# Patient Record
Sex: Male | Born: 1965 | Race: White | Hispanic: No | Marital: Married | Smoking: Never smoker
Health system: Southern US, Community
[De-identification: ages and names within clinical notes are randomized; demographics above are authoritative.]

## PROBLEM LIST (undated history)

## (undated) DIAGNOSIS — Z789 Other specified health status: Secondary | ICD-10-CM

## (undated) HISTORY — PX: VASECTOMY: SHX75

---

## 2003-05-19 ENCOUNTER — Observation Stay (HOSPITAL_COMMUNITY): Admission: AC | Admit: 2003-05-19 | Discharge: 2003-05-20 | Payer: Self-pay

## 2003-05-19 ENCOUNTER — Encounter: Payer: Self-pay | Admitting: Emergency Medicine

## 2011-04-25 ENCOUNTER — Ambulatory Visit (HOSPITAL_COMMUNITY)
Admission: RE | Admit: 2011-04-25 | Discharge: 2011-04-25 | Disposition: A | Discharge status: Home / Self Care | Payer: BC Managed Care – PPO | Source: Ambulatory Visit | Attending: Pediatrics | Admitting: Pediatrics

## 2011-04-25 ENCOUNTER — Other Ambulatory Visit (HOSPITAL_COMMUNITY): Payer: Self-pay | Admitting: Pediatrics

## 2011-04-25 DIAGNOSIS — M25579 Pain in unspecified ankle and joints of unspecified foot: Secondary | ICD-10-CM | POA: Insufficient documentation

## 2011-04-25 DIAGNOSIS — R52 Pain, unspecified: Secondary | ICD-10-CM

## 2012-09-28 IMAGING — CR DG ANKLE COMPLETE 3+V*L*
3 series · 3 of 3 positions shown · non-contrast
Comparison: None

CLINICAL DATA: Lateral ankle pain, overuse injury 5 weeks ago

LEFT ANKLE COMPLETE - 3+ VIEW

[view not recorded (1 of 3)]
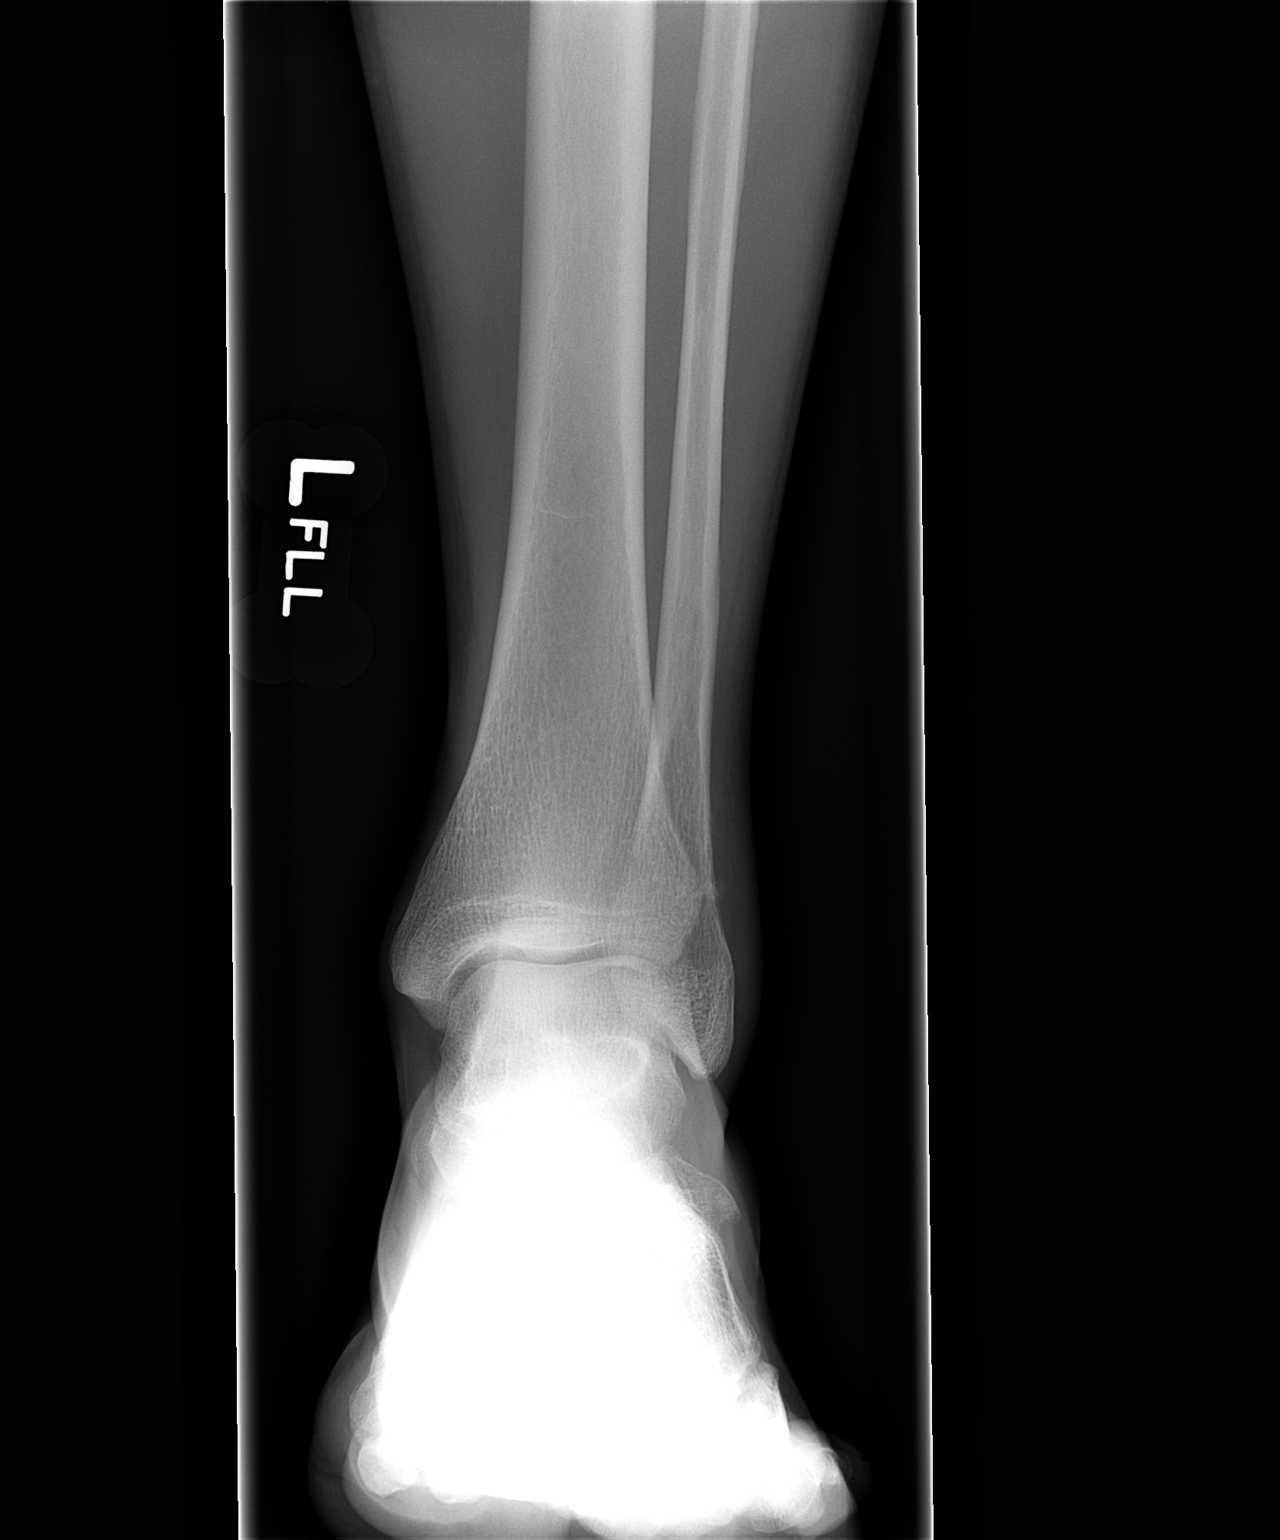

[view not recorded (2 of 3)]
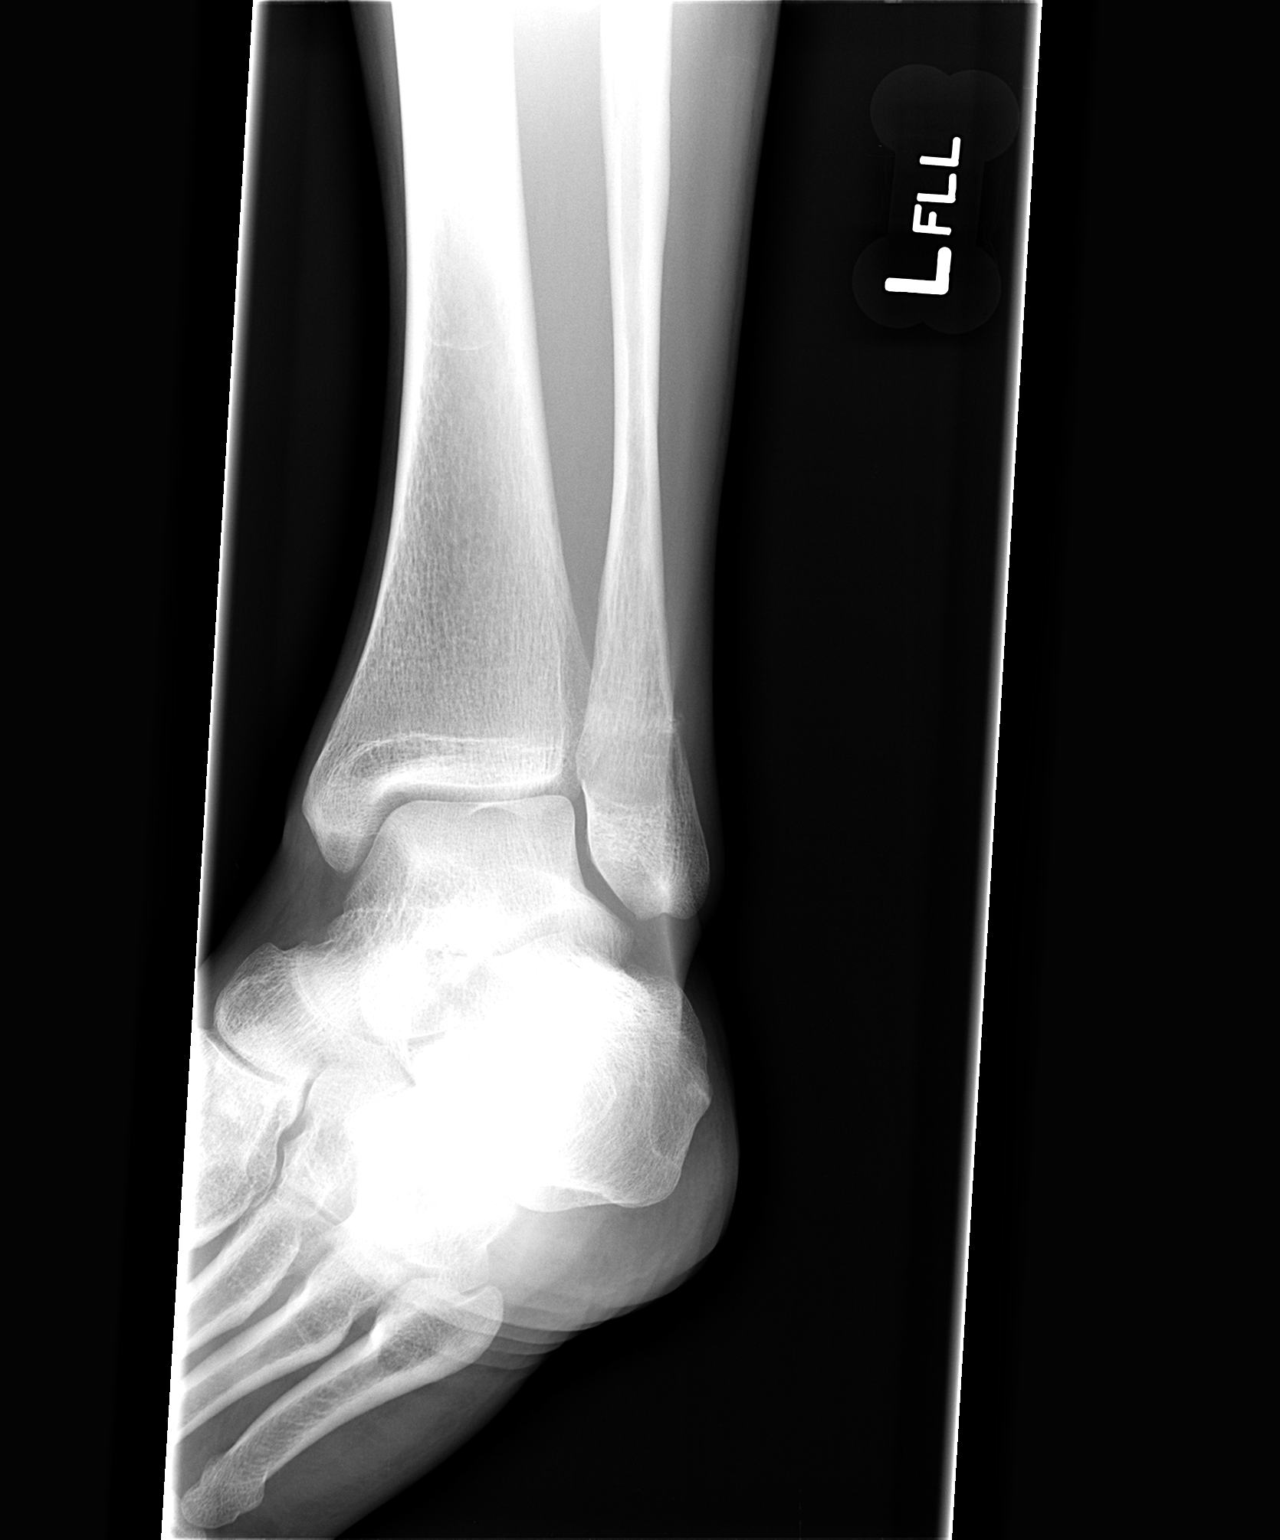

[view not recorded (3 of 3)]
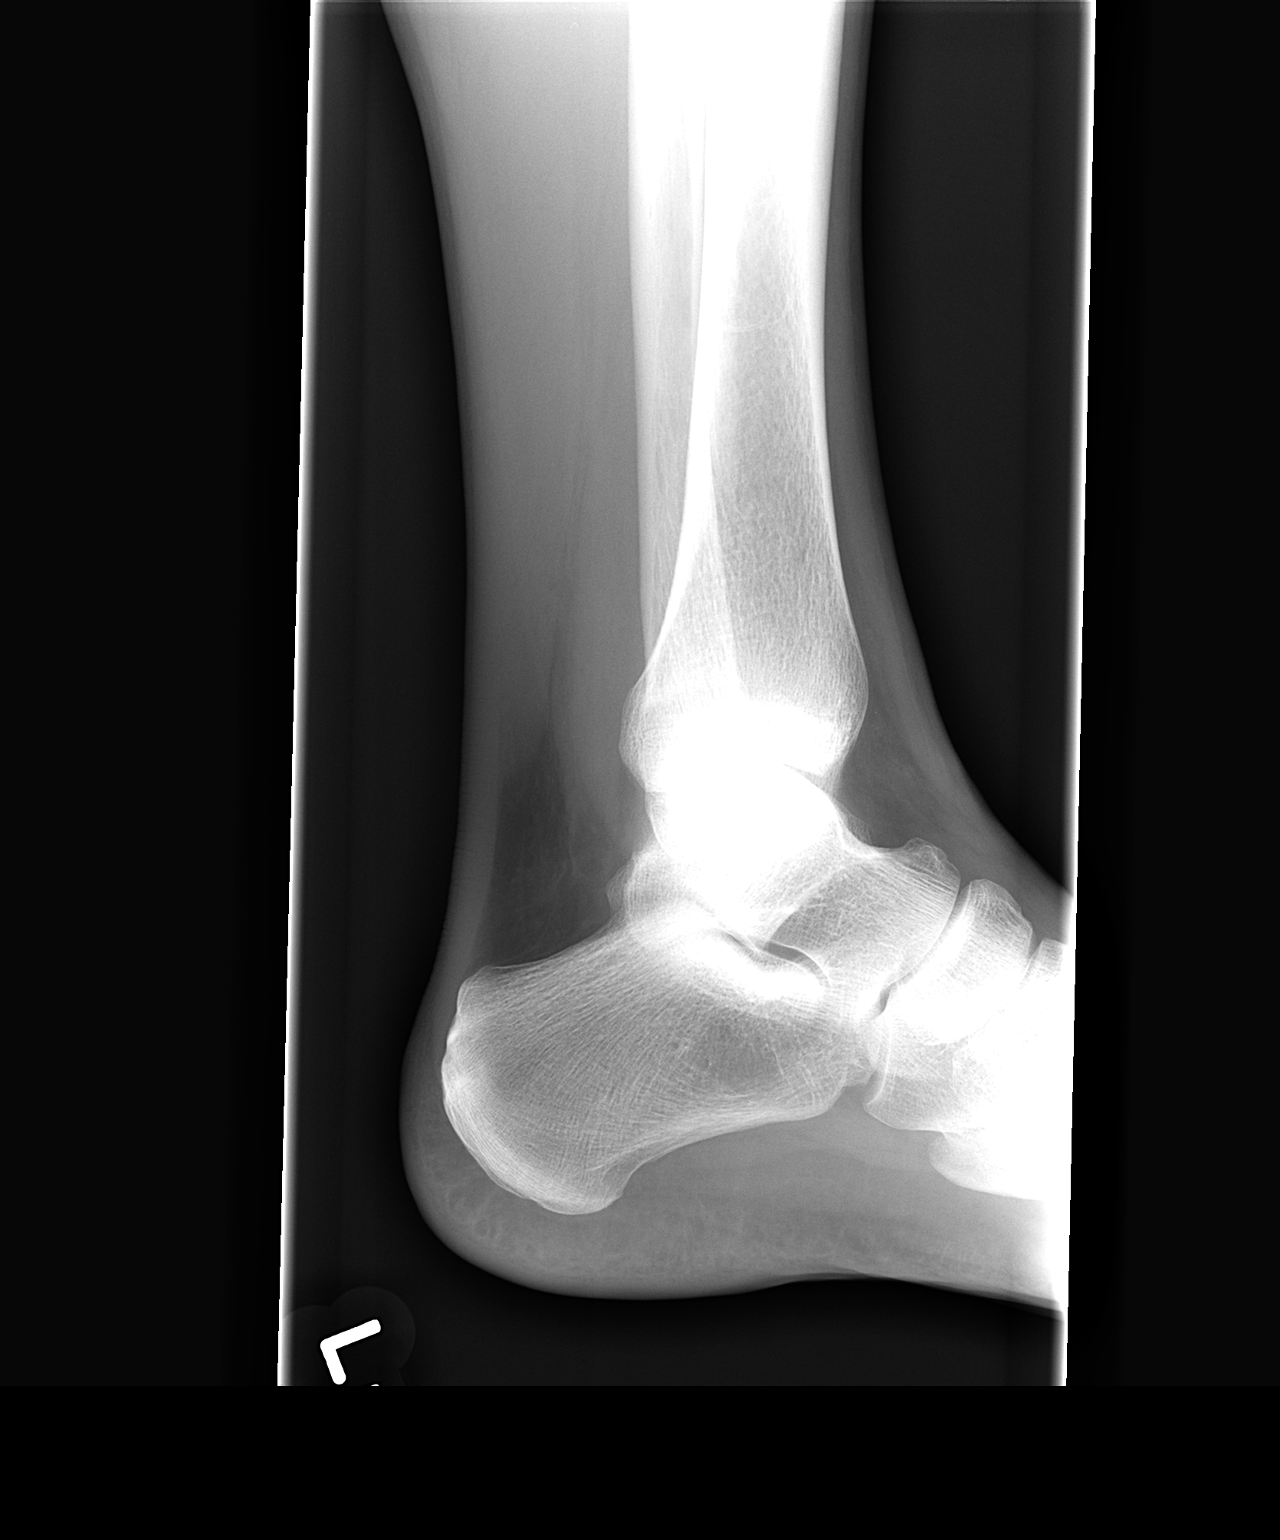

[3 of 3 positions shown; findings below may reference images not displayed]

FINDINGS: Ankle joint space preserved.
Bone mineralization normal.
No acute fracture, dislocation, or bone destruction.
IMPRESSION: Normal exam.

## 2015-08-20 ENCOUNTER — Encounter (HOSPITAL_COMMUNITY): Payer: Self-pay | Admitting: Emergency Medicine

## 2015-08-20 ENCOUNTER — Emergency Department (HOSPITAL_COMMUNITY)
Admission: EM | Admit: 2015-08-20 | Discharge: 2015-08-20 | Disposition: A | Discharge status: Home / Self Care | Payer: BC Managed Care – PPO | Source: Home / Self Care | Attending: Emergency Medicine | Admitting: Emergency Medicine

## 2015-08-20 DIAGNOSIS — R229 Localized swelling, mass and lump, unspecified: Secondary | ICD-10-CM

## 2015-08-20 NOTE — Discharge Instructions (Signed)
Cool compresses, Claritin, Zyrtec during the day, Benadryl at night. Return for the signs and symptoms we discussed.

## 2015-08-20 NOTE — ED Notes (Signed)
C/o right eye itching onset 1800 today associated w/swelling Denies inj/trauma, blurry vision A&O x4... No acute distress.

## 2015-08-20 NOTE — ED Provider Notes (Signed)
HPI  SUBJECTIVE:  Joseph Sims is a 49 y.o. male who presents with painless right-sided infraorbital swelling starting this afternoon. He states that he was itching his right eye, rubbed it lightly, and then the swelling started. No aggravating or alleviating factors. He has not tried anything for this. There is no conjunctival injection. No  erythema. No nausea, vomiting, fevers, visual changes, eye discharge, pain with extraocular movements, photophobia. No allergy symptoms, but patient does have seasonal allergies. No new lotions, soaps, detergents, foods, exposure to chemicals. No sinus pain/pressure, dental pain.  Patient has no other medical problems.   History reviewed. No pertinent past medical history.  History reviewed. No pertinent past surgical history.  No family history on file.  Social History  Substance Use Topics  . Smoking status: Never Smoker   . Smokeless tobacco: None  . Alcohol Use: Yes    No current facility-administered medications for this encounter. No current outpatient prescriptions on file.  No Known Allergies   ROS  As noted in HPI.   Physical Exam  BP 146/87 mmHg  Pulse 61  Temp(Src) 98.2 F (36.8 C) (Oral)  Resp 16  SpO2 100%  Constitutional: Well developed, well nourished, no acute distress Eyes:  EOMI, conjunctiva normal bilaterally. PERRLA, nontender right-sided infraorbital swelling. No erythema. No bruising. No photophobia.  HENT: Normocephalic, atraumatic,mucus membranes moist. No nasal congestion. No sinus tenderness. Respiratory: Normal inspiratory effort Cardiovascular: Normal rate GI: nondistended skin: No rash, skin intact Musculoskeletal: no deformities Neurologic: Alert & oriented x 3, no focal neuro deficits Psychiatric: Speech and behavior appropriate   ED Course   Medications - No data to display  No orders of the defined types were placed in this encounter.    No results found for this or any previous visit  (from the past 24 hour(s)). No results found.  ED Clinical Impression  Localized soft tissue swelling   ED Assessment/Plan Does not appear to be periorbital cellulitis or post-septal cellulitis. There is no evidence of conjunctivitis. No evidence of corneal abrasion. No evidence of sinusitis. Feel this could be an allergic reaction versus localized soft tissue swelling/edema due to minor trauma from rubbing his eye. We'll send home with antihistamines, cool compresses. Follow up with primary care physician as needed. Return here if gets worse, gave patient strict return precautions and when to go to the ER. Patient agrees with plan   *This clinic note was created using Dragon dictation software. Therefore, there may be occasional mistakes despite careful proofreading.  ?   Melynda Ripple, MD 08/20/15 [phone]

## 2015-10-05 ENCOUNTER — Telehealth: Payer: Self-pay

## 2015-10-05 NOTE — Telephone Encounter (Signed)
[id number]  PATIENT RECEIVED LETTER TO SCHEDULE TCS

## 2015-10-13 NOTE — Telephone Encounter (Signed)
Minimal ETOH use. Ok to schedule

## 2015-10-13 NOTE — Telephone Encounter (Signed)
Gastroenterology Pre-Procedure Review  Request Date: 10/05/2015 Requesting Physician: Dr. Wende Neighbors  PATIENT REVIEW QUESTIONS: The patient responded to the following health history questions as indicated:    1. Diabetes Melitis: no  2. Joint replacements in the past 12 months: no 3. Major health problems in the past 3 months: no 4. Has an artificial valve or MVP: no 5. Has a defibrillator: no 6. Has been advised in past to take antibiotics in advance of a procedure like teeth cleaning: no 7. Family history of colon cancer: no  8. Alcohol Use: Drinks a beer every 3 days 9. History of sleep apnea: no     MEDICATIONS & ALLERGIES:    Patient reports the following regarding taking any blood thinners:   Plavix? no Aspirin? no Coumadin? no  Patient confirms/reports the following medications:  No current outpatient prescriptions on file.   No current facility-administered medications for this visit.    Patient confirms/reports the following allergies:  Allergies  Allergen Reactions  . Morphine And Related Nausea Only    No orders of the defined types were placed in this encounter.    AUTHORIZATION INFORMATION Primary Insurance: ID #: Group #:  Pre-Cert / Auth required:  Pre-Cert / Auth #:   Secondary Insurance:   ID #:  Group #:  Pre-Cert / Auth required:  Pre-Cert / Auth #:   SCHEDULE INFORMATION: Procedure has been scheduled as follows:  Date: 11/03/2015             Time: 10:30 Am  Location: [facility] Short Stay  This Gastroenterology Pre-Precedure Review Form is being routed to the following provider(s): R. Garfield Cornea, MD

## 2015-10-13 NOTE — Telephone Encounter (Signed)
See separate triage.  

## 2015-10-14 ENCOUNTER — Other Ambulatory Visit: Payer: Self-pay

## 2015-10-14 DIAGNOSIS — Z1211 Encounter for screening for malignant neoplasm of colon: Secondary | ICD-10-CM

## 2015-10-14 MED ORDER — PEG 3350-KCL-NA BICARB-NACL 420 G PO SOLR: 4000.0000 mL | ORAL | Status: AC

## 2015-10-14 NOTE — Telephone Encounter (Signed)
Rx sent to the pharmacy and instructions mailed to pt.  

## 2015-10-16 ENCOUNTER — Telehealth: Payer: Self-pay

## 2015-10-16 NOTE — Telephone Encounter (Signed)
Please see the scanned in NO PA REQUIRED FOR THE TCS.

## 2015-11-03 ENCOUNTER — Ambulatory Visit (HOSPITAL_COMMUNITY)
Admission: RE | Admit: 2015-11-03 | Discharge: 2015-11-03 | Disposition: A | Discharge status: Home / Self Care | Payer: BC Managed Care – PPO | Source: Ambulatory Visit | Attending: Internal Medicine | Admitting: Internal Medicine

## 2015-11-03 ENCOUNTER — Encounter (HOSPITAL_COMMUNITY)
Admission: RE | Disposition: A | Discharge status: Home / Self Care | Payer: Self-pay | Source: Ambulatory Visit | Attending: Internal Medicine

## 2015-11-03 ENCOUNTER — Encounter (HOSPITAL_COMMUNITY): Payer: Self-pay | Admitting: *Deleted

## 2015-11-03 DIAGNOSIS — Z86010 Personal history of colonic polyps: Secondary | ICD-10-CM | POA: Insufficient documentation

## 2015-11-03 DIAGNOSIS — K635 Polyp of colon: Secondary | ICD-10-CM | POA: Diagnosis not present

## 2015-11-03 DIAGNOSIS — Z800 Family history of malignant neoplasm of digestive organs: Secondary | ICD-10-CM | POA: Insufficient documentation

## 2015-11-03 DIAGNOSIS — Z1211 Encounter for screening for malignant neoplasm of colon: Secondary | ICD-10-CM | POA: Diagnosis not present

## 2015-11-03 DIAGNOSIS — D124 Benign neoplasm of descending colon: Secondary | ICD-10-CM | POA: Insufficient documentation

## 2015-11-03 DIAGNOSIS — K5730 Diverticulosis of large intestine without perforation or abscess without bleeding: Secondary | ICD-10-CM | POA: Diagnosis not present

## 2015-11-03 HISTORY — DX: Other specified health status: Z78.9

## 2015-11-03 HISTORY — PX: COLONOSCOPY: SHX5424

## 2015-11-03 SURGERY — COLONOSCOPY
Anesthesia: Moderate Sedation

## 2015-11-03 MED ORDER — MIDAZOLAM HCL 5 MG/5ML IJ SOLN
INTRAMUSCULAR | Status: DC | PRN
  Administered 2015-11-03: 1 mg via INTRAVENOUS
  Administered 2015-11-03 (×2): 2 mg via INTRAVENOUS

## 2015-11-03 MED ORDER — MIDAZOLAM HCL 5 MG/5ML IJ SOLN
INTRAMUSCULAR | Status: AC
  Filled 2015-11-03: qty 10

## 2015-11-03 MED ORDER — SODIUM CHLORIDE 0.9 % IV SOLN
INTRAVENOUS | Status: DC
  Administered 2015-11-03: 09:00:00 via INTRAVENOUS

## 2015-11-03 MED ORDER — ONDANSETRON HCL 4 MG/2ML IJ SOLN
INTRAMUSCULAR | Status: AC
  Filled 2015-11-03: qty 2

## 2015-11-03 MED ORDER — MEPERIDINE HCL 100 MG/ML IJ SOLN
INTRAMUSCULAR | Status: DC | PRN
  Administered 2015-11-03: 25 mg via INTRAVENOUS
  Administered 2015-11-03: 50 mg via INTRAVENOUS
  Administered 2015-11-03: 25 mg via INTRAVENOUS

## 2015-11-03 MED ORDER — MEPERIDINE HCL 100 MG/ML IJ SOLN
INTRAMUSCULAR | Status: AC
  Filled 2015-11-03: qty 2

## 2015-11-03 MED ORDER — ONDANSETRON HCL 4 MG/2ML IJ SOLN
INTRAMUSCULAR | Status: DC | PRN
  Administered 2015-11-03: 4 mg via INTRAVENOUS

## 2015-11-03 MED ORDER — SODIUM CHLORIDE 0.9 % IJ SOLN
INTRAMUSCULAR | Status: DC | PRN
  Administered 2015-11-03: 6 mL via INTRAVENOUS

## 2015-11-03 MED ORDER — STERILE WATER FOR IRRIGATION IR SOLN
Status: DC | PRN
  Administered 2015-11-03: 09:00:00

## 2015-11-03 NOTE — H&P (Signed)
@  [id number]   Primary Care Physician:  Loralyn Freshwater, MD Primary Gastroenterologist:  Dr. Gala Romney  Pre-Procedure History & Physical: HPI:  Joseph Sims is a 50 y.o. male is here for a screening colonoscopy. No bowel symptoms. Family history of colon cancer. No prior colonoscopy.  Past Medical History  Diagnosis Date  . Medical history non-contributory     Past Surgical History  Procedure Laterality Date  . Vasectomy      Prior to Admission medications   Medication Sig Start Date End Date Taking? Authorizing Provider  polyethylene glycol-electrolytes (TRILYTE) 420 g solution Take 4,000 mLs by mouth as directed. 10/14/15  Yes Daneil Dolin, MD    Allergies as of 10/14/2015 - Review Complete 08/20/2015  Allergen Reaction Noted  . Morphine and related Nausea Only 10/13/2015    History reviewed. No pertinent family history.  Social History   Social History  . Marital Status: Married    Spouse Name: N/A  . Number of Children: N/A  . Years of Education: N/A   Occupational History  . Not on file.   Social History Main Topics  . Smoking status: Never Smoker   . Smokeless tobacco: Not on file  . Alcohol Use: 0.6 oz/week    1 Cans of beer per week  . Drug Use: No  . Sexual Activity: Not on file   Other Topics Concern  . Not on file   Social History Narrative    Review of Systems: See HPI, otherwise negative ROS  Physical Exam: BP 122/91 mmHg  Pulse 83  Temp(Src) 98.6 F (37 C) (Oral)  Resp 12  Ht 5\' 10"  (1.778 m)  Wt 170 lb (77.111 kg)  BMI 24.39 kg/m2  SpO2 96% General:   Alert,  Well-developed, well-nourished, pleasant and cooperative in NAD Head:  Normocephalic and atraumatic. onchi. No acute distress. Heart:  Regular rate and rhythm; no murmurs, clicks, rubs,  or gallops. Abdomen:  Soft, nontender and nondistended. No masses, hepatosplenomegaly or hernias noted. Normal bowel sounds, without guarding, and without rebound.   Msk:  Symmetrical without gross  deformities. Normal posture. Pulses:  Normal pulses noted. Extremities:  Without clubbing or edema.   Impression/Plan: Joseph Sims is now here to undergo a screening colonoscopy.  First ever average risk screening examination  Risks, benefits, limitations, imponderables and alternatives regarding colonoscopy have been reviewed with the patient. Questions have been answered. All parties agreeable.     Notice:  This dictation was prepared with Dragon dictation along with smaller phrase technology. Any transcriptional errors that result from this process are unintentional and may not be corrected upon review.

## 2015-11-03 NOTE — Op Note (Signed)
[facility] [address] [place], [address]   COLONOSCOPY PROCEDURE REPORT  PATIENT: Joseph, Sims  MR#: [id number] BIRTHDATE: 11-27-1965 , 49  yrs. old GENDER: male ENDOSCOPIST: R.  Garfield Cornea, MD FACP [facility] REFERRED [id number] Hall, M.D. PROCEDURE DATE:  16-Nov-2015 PROCEDURE:   Colonoscopy with biopsy INDICATIONS:First for average risk colorectal cancer screening examination. MEDICATIONS: Versed 5 mg IV and Demerol 100 mg IV in divided doses. Zofran 4 mg IV. ASA CLASS:       Class I  CONSENT: The risks, benefits, alternatives and imponderables including but not limited to bleeding, perforation as well as the possibility of a missed lesion have been reviewed.  The potential for biopsy, lesion removal, etc. have also been discussed. Questions have been answered.  All parties agreeable.  Please see the history and physical in the medical record for more information.  DESCRIPTION OF PROCEDURE:   After the risks benefits and alternatives of the procedure were thoroughly explained, informed consent was obtained.  The digital rectal exam revealed no abnormalities of the rectum.   The [id number]  endoscope was introduced through the anus and advanced to the cecum, which was identified by both the appendix and ileocecal valve. No adverse events experienced.   The quality of the prep was adequate  The instrument was then slowly withdrawn as the colon was fully examined. Estimated blood loss is zero unless otherwise noted in this procedure report.      COLON FINDINGS: Normal-appearing rectal mucosa.  Scattered left-sided diverticula.  Deep within 2 folds, that were very close together, therewas a pedunculated 6 mm polyp which was very difficult to engage with the snare.  After some time trying the rotatable and fixed smaller snare, I still could not engage it.  I injected approximately 4 mL of normal saline and did lift it up a bit.  I could still  not engaged with the snare.Ultimately, I removed this cleanly with 2 passes of cold biopsy forceps.  The remainder of the colonic mucosa appeared normal.  Retroflexion was performed. .  Withdrawal time=35 minutes 0 seconds.  The scope was withdrawn and the procedure completed. COMPLICATIONS: There were no immediate complications. EBL 3 mL ENDOSCOPIC IMPRESSION: Colonic diverticulosis. Colonic polyp?"removed as described above  RECOMMENDATIONS: Follow-up on pathology. Consider daily Benefiber.  eSigned:  R. Garfield Cornea, MD Rosalita Chessman [facility] 11-16-15 10:02 AM   cc:  CPT CODES: ICD CODES:  The ICD and CPT codes recommended by this software are interpretations from the data that the clinical staff has captured with the software.  The verification of the translation of this report to the ICD and CPT codes and modifiers is the sole responsibility of the health care institution and practicing physician where this report was generated.  [employer] will not be held responsible for the validity of the ICD and CPT codes included on this report.  AMA assumes no liability for data contained or not contained herein. CPT is a Designer, television/film set of the [employer].  PATIENT NAME:  Joseph, Sims MR#: [id number]

## 2015-11-03 NOTE — Discharge Instructions (Signed)
Colonoscopy Discharge Instructions  Read the instructions outlined below and refer to this sheet in the next few weeks. These discharge instructions provide you with general information on caring for yourself after you leave the hospital. Your doctor may also give you specific instructions. While your treatment has been planned according to the most current medical practices available, unavoidable complications occasionally occur. If you have any problems or questions after discharge, call Dr. Gala Romney at [phone]. ACTIVITY  You may resume your regular activity, but move at a slower pace for the next 24 hours.   Take frequent rest periods for the next 24 hours.   Walking will help get rid of the air and reduce the bloated feeling in your belly (abdomen).   No driving for 24 hours (because of the medicine (anesthesia) used during the test).    Do not sign any important legal documents or operate any machinery for 24 hours (because of the anesthesia used during the test).  NUTRITION  Drink plenty of fluids.   You may resume your normal diet as instructed by your doctor.   Begin with a light meal and progress to your normal diet. Heavy or fried foods are harder to digest and may make you feel sick to your stomach (nauseated).   Avoid alcoholic beverages for 24 hours or as instructed.  MEDICATIONS  You may resume your normal medications unless your doctor tells you otherwise.  WHAT YOU CAN EXPECT TODAY  Some feelings of bloating in the abdomen.   Passage of more gas than usual.   Spotting of blood in your stool or on the toilet paper.  IF YOU HAD POLYPS REMOVED DURING THE COLONOSCOPY:  No aspirin products for 7 days or as instructed.   No alcohol for 7 days or as instructed.   Eat a soft diet for the next 24 hours.  FINDING OUT THE RESULTS OF YOUR TEST Not all test results are available during your visit. If your test results are not back during the visit, make an appointment  with your caregiver to find out the results. Do not assume everything is normal if you have not heard from your caregiver or the medical facility. It is important for you to follow up on all of your test results.  SEEK IMMEDIATE MEDICAL ATTENTION IF:  You have more than a spotting of blood in your stool.   Your belly is swollen (abdominal distention).   You are nauseated or vomiting.   You have a temperature over 101.   You have abdominal pain or discomfort that is severe or gets worse throughout the day.    Colon polyp and diverticulosis information provided  Further recommendations to follow pending review of pathologist  Consider taking Benefiber 2 tablespoons twice daily to smooth your bowel function out   Colon Polyps Polyps are lumps of extra tissue growing inside the body. Polyps can grow in the large intestine (colon). Most colon polyps are noncancerous (benign). However, some colon polyps can become cancerous over time. Polyps that are larger than a pea may be harmful. To be safe, caregivers remove and test all polyps. CAUSES  Polyps form when mutations in the genes cause your cells to grow and divide even though no more tissue is needed. RISK FACTORS There are a number of risk factors that can increase your chances of getting colon polyps. They include:  Being older than 50 years.  Family history of colon polyps or colon cancer.  Long-term colon diseases, such as colitis  or Crohn disease.  Being overweight.  Smoking.  Being inactive.  Drinking too much alcohol. SYMPTOMS  Most small polyps do not cause symptoms. If symptoms are present, they may include:  Blood in the stool. The stool may look dark red or black.  Constipation or diarrhea that lasts longer than 1 week. DIAGNOSIS People often do not know they have polyps until their caregiver finds them during a regular checkup. Your caregiver can use 4 tests to check for polyps:  Digital rectal exam. The  caregiver wears gloves and feels inside the rectum. This test would find polyps only in the rectum.  Barium enema. The caregiver puts a liquid called barium into your rectum before taking X-rays of your colon. Barium makes your colon look white. Polyps are dark, so they are easy to see in the X-ray pictures.  Sigmoidoscopy. A thin, flexible tube (sigmoidoscope) is placed into your rectum. The sigmoidoscope has a light and tiny camera in it. The caregiver uses the sigmoidoscope to look at the last third of your colon.  Colonoscopy. This test is like sigmoidoscopy, but the caregiver looks at the entire colon. This is the most common method for finding and removing polyps. TREATMENT  Any polyps will be removed during a sigmoidoscopy or colonoscopy. The polyps are then tested for cancer. PREVENTION  To help lower your risk of getting more colon polyps:  Eat plenty of fruits and vegetables. Avoid eating fatty foods.  Do not smoke.  Avoid drinking alcohol.  Exercise every day.  Lose weight if recommended by your caregiver.  Eat plenty of calcium and folate. Foods that are rich in calcium include milk, cheese, and broccoli. Foods that are rich in folate include chickpeas, kidney beans, and spinach. HOME CARE INSTRUCTIONS Keep all follow-up appointments as directed by your caregiver. You may need periodic exams to check for polyps. SEEK MEDICAL CARE IF: You notice bleeding during a bowel movement.   This information is not intended to replace advice given to you by your health care provider. Make sure you discuss any questions you have with your health care provider.   Document Released: 05/18/2004 Document Revised: 09/12/2014 Document Reviewed: 11/01/2011 Elsevier Interactive Patient Education 2016 [employer].    Diverticulosis Diverticulosis is the condition that develops when small pouches (diverticula) form in the wall of your colon. Your colon, or large intestine, is where water  is absorbed and stool is formed. The pouches form when the inside layer of your colon pushes through weak spots in the outer layers of your colon. CAUSES  No one knows exactly what causes diverticulosis. RISK FACTORS  Being older than 60. Your risk for this condition increases with age. Diverticulosis is rare in people younger than 40 years. By age 52, almost everyone has it.  Eating a low-fiber diet.  Being frequently constipated.  Being overweight.  Not getting enough exercise.  Smoking.  Taking over-the-counter pain medicines, like aspirin and ibuprofen. SYMPTOMS  Most people with diverticulosis do not have symptoms. DIAGNOSIS  Because diverticulosis often has no symptoms, health care providers often discover the condition during an exam for other colon problems. In many cases, a health care provider will diagnose diverticulosis while using a flexible scope to examine the colon (colonoscopy). TREATMENT  If you have never developed an infection related to diverticulosis, you may not need treatment. If you have had an infection before, treatment may include:  Eating more fruits, vegetables, and grains.  Taking a fiber supplement.  Taking a live bacteria  supplement (probiotic).  Taking medicine to relax your colon. HOME CARE INSTRUCTIONS   Drink at least 6-8 glasses of water each day to prevent constipation.  Try not to strain when you have a bowel movement.  Keep all follow-up appointments. If you have had an infection before:  Increase the fiber in your diet as directed by your health care provider or dietitian.  Take a dietary fiber supplement if your health care provider approves.  Only take medicines as directed by your health care provider. SEEK MEDICAL CARE IF:   You have abdominal pain.  You have bloating.  You have cramps.  You have not gone to the bathroom in 3 days. SEEK IMMEDIATE MEDICAL CARE IF:   Your pain gets worse.  Yourbloating becomes  very bad.  You have a fever or chills, and your symptoms suddenly get worse.  You begin vomiting.  You have bowel movements that are bloody or black. MAKE SURE YOU:  Understand these instructions.  Will watch your condition.  Will get help right away if you are not doing well or get worse.   This information is not intended to replace advice given to you by your health care provider. Make sure you discuss any questions you have with your health care provider.   Document Released: 05/19/2004 Document Revised: 08/27/2013 Document Reviewed: 07/17/2013 Elsevier Interactive Patient Education [employer].

## 2015-11-05 ENCOUNTER — Encounter (HOSPITAL_COMMUNITY): Payer: Self-pay | Admitting: Internal Medicine

## 2015-11-05 ENCOUNTER — Encounter: Payer: Self-pay | Admitting: Internal Medicine
